# Patient Record
Sex: Male | Born: 1975 | Hispanic: No | Marital: Married | Smoking: Current every day smoker
Health system: Southern US, Community
[De-identification: ages and names within clinical notes are randomized; demographics above are authoritative.]

---

## 2014-09-10 ENCOUNTER — Ambulatory Visit (INDEPENDENT_AMBULATORY_CARE_PROVIDER_SITE_OTHER): Payer: Medicaid Other | Admitting: Family Medicine

## 2014-09-10 ENCOUNTER — Encounter: Payer: Self-pay | Admitting: Family Medicine

## 2014-09-10 VITALS — BP 113/81 | HR 78 | Temp 98.5°F | Ht 71.0 in | Wt 155.3 lb

## 2014-09-10 DIAGNOSIS — Z603 Acculturation difficulty: Secondary | ICD-10-CM

## 2014-09-10 DIAGNOSIS — Z0289 Encounter for other administrative examinations: Secondary | ICD-10-CM | POA: Insufficient documentation

## 2014-09-10 DIAGNOSIS — R519 Headache, unspecified: Secondary | ICD-10-CM | POA: Insufficient documentation

## 2014-09-10 DIAGNOSIS — L989 Disorder of the skin and subcutaneous tissue, unspecified: Secondary | ICD-10-CM

## 2014-09-10 DIAGNOSIS — L720 Epidermal cyst: Secondary | ICD-10-CM

## 2014-09-10 DIAGNOSIS — Z008 Encounter for other general examination: Secondary | ICD-10-CM

## 2014-09-10 DIAGNOSIS — R51 Headache: Secondary | ICD-10-CM

## 2014-09-10 DIAGNOSIS — R109 Unspecified abdominal pain: Secondary | ICD-10-CM

## 2014-09-10 DIAGNOSIS — J302 Other seasonal allergic rhinitis: Secondary | ICD-10-CM

## 2014-09-10 NOTE — Progress Notes (Signed)
Patient ID: Austin Wright, male   DOB: 02-05-1976, 39 y.o.   MRN: [id number] Arabic interpreter Huntley Dec from [employer] utilized during today's visit.  [facility]  HPI:  Patient presents today for a new patient appointment to establish general primary care, also to discuss headaches and to est care.   HA Seems to be associated with hunger, onset after missing a meal Described as band-like dist, lasting 2-3 hours and helped buy OTC meds in [place] which could not be named No nausea or photophobia.  Intermittent  Rash Noticed darkening of the skin of his cheeks for th elast 8 years, no precipitating event Gets darker intermittently, not necessarily associated with sun exposure  Lump on axilla Present 10+ years, non painful Yellow drainage if he squeezes it  Lower abd pain Occurs if he has have a BM Goes away when he has a BM Has been going on for about 1 year No diarrhea, normal stools Has resolved since arriving here  ROS: See HPI  Immigrant Social History: - Arrived in [place]: Oct 2015 - Language: Arabic  - Requires intepreter (essentially speaks no [place]) - Education: 9 years, 6 primary and 3 secondary - 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, handy work in [place] - Contact: [phone] - Tobacco/alcohol/drug use:   - Tobacco - smokes, 1 ppd for th elast 18 years  - Alcohol - once a year  - Drugs - No - Sexual activity: Yes, sometimes condoms - History of torture: no  Preventative Care History: -Seen at health department?: yes  Past Medical and Surgical Hx- See relevant portions of EMR  Family Hx: updated in Epic  PHYSICAL EXAM: BP 113/81 mmHg  Pulse 78  Temp(Src) 98.5 F (36.9 C) (Oral)  Ht  (1.803 m)  Wt 155 lb 4.8 oz (70.444 kg)  BMI 21.67 kg/m2 Gen: NAD, alert, cooperative with exam HEENT: NCAT, EOMI, PERRL, Nares clear, TMs clear Bl, MMM, oropharynx clear  CV: RRR, good S1/S2, no murmur Resp: CTABL, no  wheezes, non-labored Abd: SNTND, BS present, no guarding or organomegaly Ext: No edema, warm Neuro: Alert and oriented, No gross deficits Skin: Coalescing hyperpigmented macules on BL cheeks  5-10 Small (less than 1 mm) erythematous papules on chest and neck R axilla with 1 cm soft subcutaneous nodule with central umbilication, non irritated or tender to palpation     ASSESSMENT/PLAN:  Headache Tension type headache associated with hunger  no red flags Tylenol   Epidermal inclusion cyst Axillary EIC, non irritated Discussed usual course of cyst No intervention for now   Seasonal allergies Seasonal allergies with conjunctivitis and congestion Trial of zyrtec  Skin abnormality On exam has several cherry angiomas and freckles Provided reassurance  Abdominal pain Mild intermittent abd pain just previous to BMs when he has to delay BM for some reason Now resolved since coming to this country (last 2 months) Likely functional abd pain, no red flags, monitor    FOLLOW UP: F/u in 2 weeks to discuss labs.    Murtis Sink, MD [facility] Resident, PGY-3 09/10/2014, 4:25 PM

## 2014-09-10 NOTE — Assessment & Plan Note (Signed)
Axillary EIC, non irritated Discussed usual course of cyst No intervention for now

## 2014-09-10 NOTE — Assessment & Plan Note (Signed)
Seasonal allergies with conjunctivitis and congestion Trial of zyrtec

## 2014-09-10 NOTE — Patient Instructions (Signed)
   For your itchy eyes and congestion, try cetirizine, 1 pill once a day  Try tylenol (acetaminaphen) for headaches, 2 pills up to 3 times a day  Please follow up in 2 weeks when your wife comes in.

## 2014-09-10 NOTE — Assessment & Plan Note (Signed)
Mild intermittent abd pain just previous to BMs when he has to delay BM for some reason Now resolved since coming to this country (last 2 months) Likely functional abd pain, no red flags, monitor

## 2014-09-10 NOTE — Assessment & Plan Note (Signed)
Tension type headache associated with hunger  no red flags Tylenol

## 2014-09-10 NOTE — Assessment & Plan Note (Signed)
On exam has several cherry angiomas and freckles Provided reassurance

## 2014-09-14 ENCOUNTER — Ambulatory Visit
Admission: RE | Admit: 2014-09-14 | Discharge: 2014-09-14 | Disposition: A | Discharge status: Home / Self Care | Payer: No Typology Code available for payment source | Source: Ambulatory Visit | Attending: Infectious Disease | Admitting: Infectious Disease

## 2014-09-14 ENCOUNTER — Other Ambulatory Visit: Payer: Self-pay | Admitting: Infectious Disease

## 2014-09-14 DIAGNOSIS — Z139 Encounter for screening, unspecified: Secondary | ICD-10-CM

## 2014-09-21 ENCOUNTER — Ambulatory Visit: Payer: Medicaid Other | Admitting: Family Medicine

## 2015-02-25 ENCOUNTER — Emergency Department (HOSPITAL_COMMUNITY): Payer: Medicaid Other

## 2015-02-25 ENCOUNTER — Emergency Department (HOSPITAL_COMMUNITY)
Admission: EM | Admit: 2015-02-25 | Discharge: 2015-02-25 | Disposition: A | Discharge status: Home / Self Care | Payer: Medicaid Other | Attending: Emergency Medicine | Admitting: Emergency Medicine

## 2015-02-25 DIAGNOSIS — R05 Cough: Secondary | ICD-10-CM | POA: Diagnosis not present

## 2015-02-25 DIAGNOSIS — R51 Headache: Secondary | ICD-10-CM | POA: Diagnosis present

## 2015-02-25 DIAGNOSIS — M25511 Pain in right shoulder: Secondary | ICD-10-CM | POA: Diagnosis not present

## 2015-02-25 DIAGNOSIS — R5383 Other fatigue: Secondary | ICD-10-CM | POA: Diagnosis not present

## 2015-02-25 DIAGNOSIS — R6883 Chills (without fever): Secondary | ICD-10-CM | POA: Diagnosis not present

## 2015-02-25 DIAGNOSIS — R059 Cough, unspecified: Secondary | ICD-10-CM

## 2015-02-25 DIAGNOSIS — M545 Low back pain: Secondary | ICD-10-CM | POA: Diagnosis not present

## 2015-02-25 LAB — COMPREHENSIVE METABOLIC PANEL
ALBUMIN: 3.7 g/dL (ref 3.5–5.0)
ALT: 22 U/L (ref 17–63)
ANION GAP: 8 (ref 5–15)
AST: 20 U/L (ref 15–41)
Alkaline Phosphatase: 72 U/L (ref 38–126)
BUN: 7 mg/dL (ref 6–20)
CALCIUM: 9.8 mg/dL (ref 8.9–10.3)
CHLORIDE: 106 mmol/L (ref 101–111)
CO2: 25 mmol/L (ref 22–32)
Creatinine, Ser: 0.83 mg/dL (ref 0.61–1.24)
GFR calc non Af Amer: >60 mL/min (ref >60)
Glucose, Bld: 106 mg/dL — ABNORMAL HIGH (ref 65–99)
Potassium: 3.9 mmol/L (ref 3.5–5.1)
Sodium: 139 mmol/L (ref 135–145)
TOTAL PROTEIN: 7.6 g/dL (ref 6.5–8.1)
Total Bilirubin: 1 mg/dL (ref 0.3–1.2)

## 2015-02-25 LAB — CBC WITH DIFFERENTIAL/PLATELET
BASOS PCT: 0 % (ref 0–1)
Basophils Absolute: 0 10*3/uL (ref 0.0–0.1)
Eosinophils Absolute: 0.2 10*3/uL (ref 0.0–0.7)
Eosinophils Relative: 2 % (ref 0–5)
HCT: 47.3 % (ref 39.0–52.0)
HEMOGLOBIN: 16.8 g/dL (ref 13.0–17.0)
LYMPHS ABS: 2.7 10*3/uL (ref 0.7–4.0)
Lymphocytes Relative: 29 % (ref 12–46)
MCH: 30.3 pg (ref 26.0–34.0)
MCHC: 35.5 g/dL (ref 30.0–36.0)
MCV: 85.4 fL (ref 78.0–100.0)
MONO ABS: 0.9 10*3/uL (ref 0.1–1.0)
MONOS PCT: 9 % (ref 3–12)
Neutro Abs: 5.6 10*3/uL (ref 1.7–7.7)
Neutrophils Relative %: 60 % (ref 43–77)
Platelets: 200 10*3/uL (ref 150–400)
RBC: 5.54 MIL/uL (ref 4.22–5.81)
RDW: 12.8 % (ref 11.5–15.5)
WBC: 9.3 10*3/uL (ref 4.0–10.5)

## 2015-02-25 MED ORDER — IBUPROFEN 400 MG PO TABS
400.0000 mg | ORAL_TABLET | Freq: Once | ORAL | Status: AC
  Administered 2015-02-25: 400 mg via ORAL
  Filled 2015-02-25: qty 1

## 2015-02-25 NOTE — ED Notes (Signed)
Pt states that he has had headache, bodyaches and chills for 3 days. Pt states that he took medication at home for the headache but it did not help.

## 2015-02-25 NOTE — ED Notes (Signed)
Pt being tra

## 2015-02-25 NOTE — ED Notes (Signed)
Patient transported to X-ray 

## 2015-02-25 NOTE — ED Notes (Signed)
Pt speaks arabic

## 2015-02-25 NOTE — Discharge Instructions (Signed)
Take Tylenol or Advil as directed for pain. Contact the [facility] to schedule next available appointment. Ask your physician to help you to stop smoking

## 2015-02-25 NOTE — ED Provider Notes (Signed)
CSN: [id number]     Arrival date & time 02/25/15  1154 History   First MD Initiated Contact with Patient 02/25/15 1323     Chief Complaint  Patient presents with  . Generalized Body Aches  . Chills  . Headache   Patient speaks no English history is obtained from medical interpreter using [employer] which line  (Consider location/radiation/quality/duration/timing/severity/associated sxs/prior Treatment) HPI Complains of fatigue, nonproductive cough, pain at shoulder and at right paralumbar area for the past 2 days. He admits to chills. No known fever. Denies shortness of breath no nausea or vomiting no abdominal pain. Also admits to congestion in his sinuses. No treatment prior to coming here. Nothing makes symptoms better or worse. No past medical history on file. past medical history negative No past surgical history on file. No family history on file. History  Substance Use Topics  . Smoking status: Not on file  . Smokeless tobacco: Not on file  . Alcohol Use: Not on file    positive smoker no alcohol no drug use Review of Systems  Constitutional: Positive for chills and fatigue.  HENT: Negative.   Respiratory: Positive for cough.   Cardiovascular: Negative.   Gastrointestinal: Negative.   Musculoskeletal: Positive for back pain and arthralgias.  Skin: Negative.   Neurological: Negative.   Psychiatric/Behavioral: Negative.   All other systems reviewed and are negative.     Allergies  Review of patient's allergies indicates no known allergies.  Home Medications   Prior to Admission medications   Not on File   BP 117/90 mmHg  Pulse 85  Temp(Src) 97.5 F (36.4 C) (Oral)  Resp 18  SpO2 97% Physical Exam  Constitutional: He appears well-developed and well-nourished.  HENT:  Head: Normocephalic and atraumatic.  Eyes: Conjunctivae are normal. Pupils are equal, round, and reactive to light.  Neck: Neck supple. No tracheal deviation present. No thyromegaly  present.  Cardiovascular: Normal rate and regular rhythm.   No murmur heard. Pulmonary/Chest: Effort normal and breath sounds normal.  Abdominal: Soft. Bowel sounds are normal. He exhibits no distension. There is no tenderness.  Musculoskeletal: Normal range of motion. He exhibits no edema or tenderness.  Neurological: He is alert. No cranial nerve deficit. Coordination normal.  Skin: Skin is warm and dry. No rash noted.  Psychiatric: He has a normal mood and affect.  Nursing note and vitals reviewed.   ED Course  Procedures (including critical care time) Labs Review Labs Reviewed  COMPREHENSIVE METABOLIC PANEL - Abnormal; Notable for the following:    Glucose, Bld 106 (*)    All other components within normal limits  CBC WITH DIFFERENTIAL/PLATELET    Imaging Review No results found.   EKG Interpretation None     4:15 PM asymptomatic after treatment with ibuprofen. Chest x-ray viewed by me Results for orders placed or performed during the hospital encounter of 02/25/15  CBC WITH DIFFERENTIAL  Result Value Ref Range   WBC 9.3 4.0 - 10.5 K/uL   RBC 5.54 4.22 - 5.81 MIL/uL   Hemoglobin 16.8 13.0 - 17.0 g/dL   HCT 76.2 26.3 - 33.5 %   MCV 85.4 78.0 - 100.0 fL   MCH 30.3 26.0 - 34.0 pg   MCHC 35.5 30.0 - 36.0 g/dL   RDW 45.6 25.6 - 38.9 %   Platelets 200 150 - 400 K/uL   Neutrophils Relative % 60 43 - 77 %   Neutro Abs 5.6 1.7 - 7.7 K/uL   Lymphocytes Relative 29 12 -  46 %   Lymphs Abs 2.7 0.7 - 4.0 K/uL   Monocytes Relative 9 3 - 12 %   Monocytes Absolute 0.9 0.1 - 1.0 K/uL   Eosinophils Relative 2 0 - 5 %   Eosinophils Absolute 0.2 0.0 - 0.7 K/uL   Basophils Relative 0 0 - 1 %   Basophils Absolute 0.0 0.0 - 0.1 K/uL  Comprehensive metabolic panel  Result Value Ref Range   Sodium 139 135 - 145 mmol/L   Potassium 3.9 3.5 - 5.1 mmol/L   Chloride 106 101 - 111 mmol/L   CO2 25 22 - 32 mmol/L   Glucose, Bld 106 (H) 65 - 99 mg/dL   BUN 7 6 - 20 mg/dL   Creatinine,  Ser 1.61 0.61 - 1.24 mg/dL   Calcium 9.8 8.9 - 09.6 mg/dL   Total Protein 7.6 6.5 - 8.1 g/dL   Albumin 3.7 3.5 - 5.0 g/dL   AST 20 15 - 41 U/L   ALT 22 17 - 63 U/L   Alkaline Phosphatase 72 38 - 126 U/L   Total Bilirubin 1.0 0.3 - 1.2 mg/dL   GFR calc non Af Amer >60 >60 mL/min   GFR calc Af Amer >60 >60 mL/min   Anion gap 8 5 - 15   Dg Chest 2 View  02/25/2015   CLINICAL DATA:  Cough for the past 2 weeks.  Smoker.  EXAM: CHEST  2 VIEW  COMPARISON:  None.  FINDINGS: Lateral view degraded by patient arm position. Numerous leads and wires project over the chest. Midline trachea. Normal heart size and mediastinal contours. No pleural effusion or pneumothorax. Biapical of reticular nodular opacities with calcification. No lobar consolidation. Diffuse peribronchial thickening.  IMPRESSION: No acute cardiopulmonary disease.  Peribronchial thickening which may relate to chronic bronchitis or smoking.  Biapical reticular nodular opacities with calcification. Favored to be related to post infectious or inflammatory scarring.   Electronically Signed   By: Jeronimo Greaves M.D.   On: 02/25/2015 15:27    MDM  Suspect viral illness Plan follow-up [facility] Counseled patient for 5 minutes on smoking cessation. Tylenol or Advil for pain I suspect the patient has viral illness Diagnosis #1 cough #2 arthralgias #3 tobacco abuse Final diagnoses:  None        Doug Sou, MD 02/25/15 1621

## 2015-02-25 NOTE — ED Notes (Signed)
EDP at bedside discussing results and D/C information with patient via translator service.

## 2015-08-07 ENCOUNTER — Emergency Department (HOSPITAL_COMMUNITY): Payer: Medicaid Other

## 2015-08-07 ENCOUNTER — Encounter (HOSPITAL_COMMUNITY): Payer: Self-pay | Admitting: Emergency Medicine

## 2015-08-07 ENCOUNTER — Emergency Department (HOSPITAL_COMMUNITY)
Admission: EM | Admit: 2015-08-07 | Discharge: 2015-08-07 | Disposition: A | Discharge status: Home / Self Care | Payer: Medicaid Other | Attending: Emergency Medicine | Admitting: Emergency Medicine

## 2015-08-07 DIAGNOSIS — F17200 Nicotine dependence, unspecified, uncomplicated: Secondary | ICD-10-CM | POA: Insufficient documentation

## 2015-08-07 DIAGNOSIS — R000 Tachycardia, unspecified: Secondary | ICD-10-CM | POA: Insufficient documentation

## 2015-08-07 DIAGNOSIS — M791 Myalgia: Secondary | ICD-10-CM | POA: Diagnosis present

## 2015-08-07 DIAGNOSIS — B349 Viral infection, unspecified: Secondary | ICD-10-CM | POA: Diagnosis not present

## 2015-08-07 LAB — CBC WITH DIFFERENTIAL/PLATELET
BASOS ABS: 0 10*3/uL (ref 0.0–0.1)
BASOS PCT: 0 %
Eosinophils Absolute: 0.1 10*3/uL (ref 0.0–0.7)
Eosinophils Relative: 1 %
HEMATOCRIT: 46.2 % (ref 39.0–52.0)
Hemoglobin: 16.1 g/dL (ref 13.0–17.0)
Lymphocytes Relative: 15 %
Lymphs Abs: 1.7 10*3/uL (ref 0.7–4.0)
MCH: 30 pg (ref 26.0–34.0)
MCHC: 34.8 g/dL (ref 30.0–36.0)
MCV: 86 fL (ref 78.0–100.0)
Monocytes Absolute: 0.7 10*3/uL (ref 0.1–1.0)
Monocytes Relative: 7 %
NEUTROS PCT: 77 %
Neutro Abs: 8.8 10*3/uL — ABNORMAL HIGH (ref 1.7–7.7)
PLATELETS: 183 10*3/uL (ref 150–400)
RBC: 5.37 MIL/uL (ref 4.22–5.81)
RDW: 13 % (ref 11.5–15.5)
WBC: 11.2 10*3/uL — ABNORMAL HIGH (ref 4.0–10.5)

## 2015-08-07 LAB — BASIC METABOLIC PANEL
ANION GAP: 8 (ref 5–15)
BUN: 7 mg/dL (ref 6–20)
CALCIUM: 9.6 mg/dL (ref 8.9–10.3)
CO2: 25 mmol/L (ref 22–32)
Chloride: 105 mmol/L (ref 101–111)
Creatinine, Ser: 0.94 mg/dL (ref 0.61–1.24)
Glucose, Bld: 109 mg/dL — ABNORMAL HIGH (ref 65–99)
Potassium: 3.9 mmol/L (ref 3.5–5.1)
Sodium: 138 mmol/L (ref 135–145)

## 2015-08-07 MED ORDER — SODIUM CHLORIDE 0.9 % IV BOLUS (SEPSIS)
1000.0000 mL | Freq: Once | INTRAVENOUS | Status: AC
  Administered 2015-08-07: 1000 mL via INTRAVENOUS

## 2015-08-07 MED ORDER — SODIUM CHLORIDE 0.9 % IV BOLUS (SEPSIS)
1000.0000 mL | Freq: Once | INTRAVENOUS | Status: AC
Start: 2015-08-07 — End: 2015-08-07
  Administered 2015-08-07: 1000 mL via INTRAVENOUS

## 2015-08-07 MED ORDER — KETOROLAC TROMETHAMINE 30 MG/ML IJ SOLN
30.0000 mg | Freq: Once | INTRAMUSCULAR | Status: AC
  Administered 2015-08-07: 30 mg via INTRAVENOUS
  Filled 2015-08-07: qty 1

## 2015-08-07 MED ORDER — DEXAMETHASONE SODIUM PHOSPHATE 10 MG/ML IJ SOLN
10.0000 mg | Freq: Once | INTRAMUSCULAR | Status: AC
  Administered 2015-08-07: 10 mg via INTRAVENOUS
  Filled 2015-08-07: qty 1

## 2015-08-07 MED ORDER — ACETAMINOPHEN 500 MG PO TABS
1000.0000 mg | ORAL_TABLET | Freq: Once | ORAL | Status: AC
  Administered 2015-08-07: 1000 mg via ORAL
  Filled 2015-08-07: qty 2

## 2015-08-07 NOTE — Discharge Instructions (Signed)
Viral Infections Austin Wright, your blood work and chest xray today were normal.  It is very important for you to see a primary care doctor within 3 days for close follow up.  If any symptoms worsen, come back to the ED immediately. Thank you.   [name]         .           3    .          . . alsyd [name], kan aleamal fi alddam wal'ashieeat alssiniat sadruk alyawm aleadi. min almhm jiddaan balnsbt lk lirawyat tabib alrrieayat al'awwaliat khilal 3 'ayam limutabaeat ean qarb. 'iidha sa'at 'ay 'aeradin, yaeud 'iilaa aldduef aljinsi ealaa alfawr. shukra.   A virus is a type of germ. Viruses can cause:  Minor sore throats.  Aches and pains.  Headaches.  Runny nose.  Rashes.  Watery eyes.  Tiredness.  Coughs.  Loss of appetite.  Feeling sick to your stomach (nausea).  Throwing up (vomiting).  Watery poop (diarrhea). HOME CARE   Only take medicines as told by your doctor.  Drink enough water and fluids to keep your pee (urine) clear or pale yellow. Sports drinks are a good choice.  Get plenty of rest and eat healthy. Soups and broths with crackers or rice are fine. GET HELP RIGHT AWAY IF:   You have a very bad headache.  You have shortness of breath.  You have chest pain or neck pain.  You have an unusual rash.  You cannot stop throwing up.  You have watery poop that does not stop.  You cannot keep fluids down.  You or your child has a temperature by mouth above 102 F (38.9 C), not controlled by medicine.  Your baby is older than 3 months with a rectal temperature of 102 F (38.9 C) or higher.  Your baby is 883 months old or younger with a rectal temperature of 100.4 F (38 C) or higher. MAKE SURE YOU:   Understand these instructions.  Will watch this condition.  Will get help right away if you are  not doing well or get worse.   This information is not intended to replace advice given to you by your health care provider. Make sure you discuss any questions you have with your health care provider.   Document Released: 08/06/2008 Document Revised: 11/16/2011 Document Reviewed: 01/30/2015 Elsevier Interactive Patient Education [employer]2016 Elsevier Inc.

## 2015-08-07 NOTE — ED Notes (Addendum)
Called interpreter [phone]#[id number]

## 2015-08-07 NOTE — ED Notes (Signed)
Pt. reports generalized body aches , fever, chills and occasional dry cough onset today. Arabic interpreter service used at triage .

## 2015-08-07 NOTE — ED Provider Notes (Signed)
CSN: [id number]     Arrival date & time 08/07/15  0015 History  By signing my name below, I, Freida Busman, attest that this documentation has been prepared under the direction and in the presence of Tomasita Crumble, MD . Electronically Signed: Freida Busman, Scribe. 08/07/2015. 2:21 AM.  Chief Complaint  Patient presents with  . Generalized Body Aches    The history is provided by the patient. A language interpreter was used.    HPI Comments:  Austin Wright is a 39 y.o. male who presents to the Emergency Department complaining of generalized myalgias with 8/10 pain and fatigue. He states his symptoms began when he returned from work yesterday evening. He reports associated fever with max temp 39 C (102 F), sore throat, chills, and productive cough. Pt's sister with similar symptoms. No alleviating factors noted.  Pt is a current everyday smoker. Pt's native language is Arabic; language line utilized to obtain history.   History reviewed. No pertinent past medical history. History reviewed. No pertinent past surgical history. No family history on file. Social History  Substance Use Topics  . Smoking status: Current Every Day Smoker  . Smokeless tobacco: None  . Alcohol Use: Yes    Review of Systems  10 systems reviewed and all are negative for acute change except as noted in the HPI.  Allergies  Review of patient's allergies indicates no known allergies.  Home Medications   Prior to Admission medications   Not on File   BP 108/82 mmHg  Pulse 90  Temp(Src) 100.1 F (37.8 C) (Oral)  Resp 22  Ht 6' (1.829 m)  Wt 154 lb (69.854 kg)  BMI 20.88 kg/m2  SpO2 97% Physical Exam  Constitutional: He is oriented to person, place, and time. Vital signs are normal. He appears well-developed and well-nourished.  Non-toxic appearance. He does not appear ill. No distress.  HENT:  Head: Normocephalic and atraumatic.  Nose: Nose normal.  Mouth/Throat: Oropharynx is clear and moist.  No oropharyngeal exudate.  Eyes: Conjunctivae and EOM are normal. Pupils are equal, round, and reactive to light. No scleral icterus.  Neck: Normal range of motion. Neck supple. No tracheal deviation, no edema, no erythema and normal range of motion present. No thyroid mass and no thyromegaly present.  Cardiovascular: Regular rhythm, S1 normal, S2 normal, normal heart sounds, intact distal pulses and normal pulses.  Tachycardia present.  Exam reveals no gallop and no friction rub.   No murmur heard. Pulmonary/Chest: Effort normal and breath sounds normal. No respiratory distress. He has no wheezes. He has no rhonchi. He has no rales.  Abdominal: Soft. Normal appearance and bowel sounds are normal. He exhibits no distension, no ascites and no mass. There is no hepatosplenomegaly. There is no tenderness. There is no rebound, no guarding and no CVA tenderness.  Musculoskeletal: Normal range of motion. He exhibits no edema or tenderness.  Lymphadenopathy:    He has no cervical adenopathy.  Neurological: He is alert and oriented to person, place, and time. He has normal strength. No cranial nerve deficit or sensory deficit.  Skin: Skin is dry and intact. No petechiae and no rash noted. He is not diaphoretic. No erythema. No pallor.  Tactile Fever  Psychiatric: He has a normal mood and affect. His behavior is normal. Judgment normal.  Nursing note and vitals reviewed.   ED Course  Procedures   DIAGNOSTIC STUDIES:  Oxygen Saturation is 97% on RA, normal by my interpretation.    COORDINATION OF CARE:  2:16 AM Discussed treatment plan with pt at bedside and pt agreed to plan.  Labs Review Labs Reviewed  CBC WITH DIFFERENTIAL/PLATELET - Abnormal; Notable for the following:    WBC 11.2 (*)    Neutro Abs 8.8 (*)    All other components within normal limits  BASIC METABOLIC PANEL - Abnormal; Notable for the following:    Glucose, Bld 109 (*)    All other components within normal limits     Imaging Review Dg Chest 2 View  08/07/2015  CLINICAL DATA:  Generalized body aches, fevers and chills. Nonproductive cough. Onset today. EXAM: CHEST  2 VIEW COMPARISON:  02/25/2015 FINDINGS: Diffuse upper lobe nodularity persists without significant interval change and is likely due to sequela of prior infectious or granulomatous disease. No acute airspace opacity. No effusion. Pulmonary vasculature is normal. Hilar, mediastinal and cardiac contours are unremarkable and unchanged. Heart size is normal. IMPRESSION: Chronic upper lobe nodularity.  No acute cardiopulmonary findings. Electronically Signed   By: Ellery Plunkaniel R Mitchell M.D.   On: 08/07/2015 02:26   I have personally reviewed and evaluated these images and lab results as part of my medical decision-making.   EKG Interpretation None      MDM   Final diagnoses:  None    Patient presents to the emergency department for viral-like symptoms. His most significant complaint is the diffuse body aches which he feels goes down to all of his bones. He also has a cough which is not changed from his normal smoking cough. He states he has sick contacts and his sister who had similar symptoms. Chest x-ray is negative for pneumonia, laboratory studies are unremarkable. There is a leukocytosis of 11.2 which is nonspecific. He was given Tylenol, Toradol for his symptoms. Fever has resolved to 98.6 upon my repeat evaluation. Patient also given Decadron for sore throat. Upon repeat evaluation, patient states he feels much better.  He appears well and in no acute distress, vital signs were within his normal limits and he is safe for discharge.   I personally performed the services described in this documentation, which was scribed in my presence. The recorded information has been reviewed and is accurate.     Tomasita CrumbleAdeleke Fredrika Canby, MD 08/07/15 [phone]

## 2015-08-09 ENCOUNTER — Ambulatory Visit (INDEPENDENT_AMBULATORY_CARE_PROVIDER_SITE_OTHER): Payer: Medicaid Other | Admitting: Family Medicine

## 2015-08-09 ENCOUNTER — Encounter: Payer: Self-pay | Admitting: Family Medicine

## 2015-08-09 VITALS — BP 131/77 | HR 84 | Temp 97.8°F | Ht 71.0 in | Wt 155.5 lb

## 2015-08-09 DIAGNOSIS — Z23 Encounter for immunization: Secondary | ICD-10-CM

## 2015-08-09 DIAGNOSIS — J069 Acute upper respiratory infection, unspecified: Secondary | ICD-10-CM

## 2015-08-09 NOTE — Progress Notes (Signed)
   Subjective:    Patient ID: Austin Wright, male    DOB: [birth year], 39 y.o.   MRN: [id number]  HPI  Video interpreter used [facility] #[id number] ED visit under a different chart, MRN [id number]  Patient presents for Same Day Appointment  CC: sore throat  # URI symptoms:  Started about 5-7 days ago. Went to ED 2 days ago and was diagnosed with virus  Overall feels better  Symptoms included sinus pressure causing headache, nasal congestion, sore throat which started 2 days ago, also felt feverish (no measured) and body aches.  Has not taken anything other than ibuprofen for his symptoms  No nausea or vomiting  Friend at work saw his ED paperwork, showed his boss and said he couldn't work until he was treated  Social Hx: current smoker  Review of Systems   See HPI for ROS.   Past medical history, surgical, family, and social history reviewed and updated in the EMR as appropriate.  Objective:  BP 131/77 mmHg  Pulse 84  Temp(Src) 97.8 F (36.6 C) (Oral)  Ht 5\' 11"  (1.803 m)  Wt 155 lb 8 oz (70.534 kg)  BMI 21.70 kg/m2 Vitals and nursing note reviewed  General: NAD HEENT: normal conjunctiva and sclera. There is mild posterior pharyngeal erythema without exudate. Moist mucous membranes.  Neck: no cervical lymphadenopathy  Assessment & Plan:   1. Viral URI Agree with likely viral etiology. OTC symptomatic relief. Work note given to return Monday. If symptoms persist >1 week return to clinic. Also recommended taking daily allergy medicine as he likely has some underlying symptoms of this. Otherwise follow up as needed.

## 2015-08-09 NOTE — Patient Instructions (Signed)
Your symptoms are due to a viral illness. Antibiotics will not help improve your symptoms, but the following will help you feel better while your body fights the virus.   Stay hydrated - drink a lot of water   Nasal Saline Spray  Congestion:   Nose spray: Afrin (Phenylephrine). DO NOT USE MORE THAN 3 DAYS  Oral: Pseudoephedrine  Sneezing & Runny nose: Cetirizine (Zyrtec), Fexofenadine (Allegra), Loratadine (Claritin)  Pain/Sore throat: Tylenol, Ibuprofen  Cough: Dextromethorphan, Guaifenesin ("Mucinex")  Wash your hands often to prevent spreading the virus

## 2016-02-07 ENCOUNTER — Encounter: Payer: Self-pay | Admitting: Family Medicine

## 2017-05-28 IMAGING — DX DG CHEST 2V
2 series · 2 of 2 positions shown · non-contrast
Comparison: 02/25/2015

CLINICAL DATA: Generalized body aches, fevers and chills.
Nonproductive cough. Onset today.

EXAM:
CHEST  2 VIEW

[chest pa]
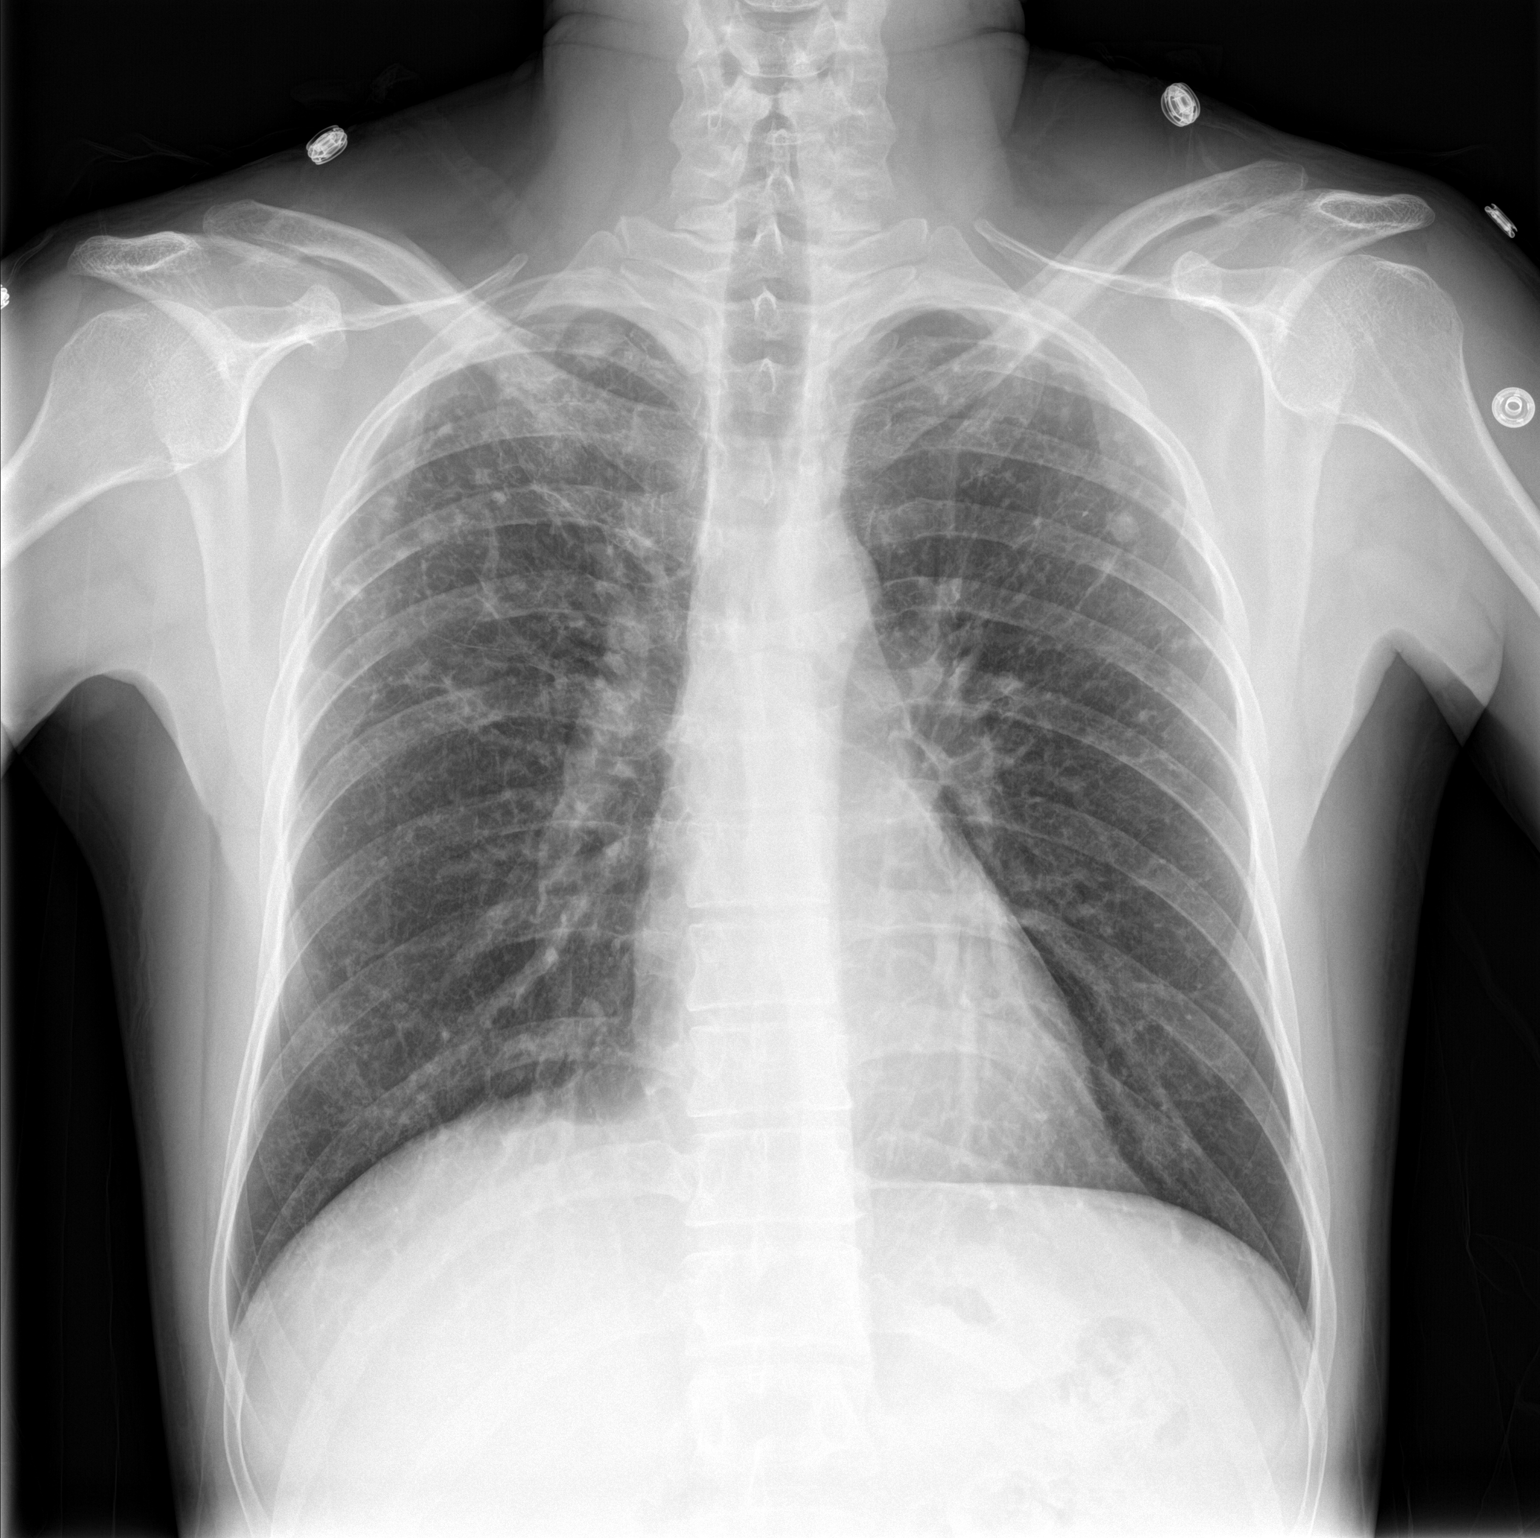

[chest lat]
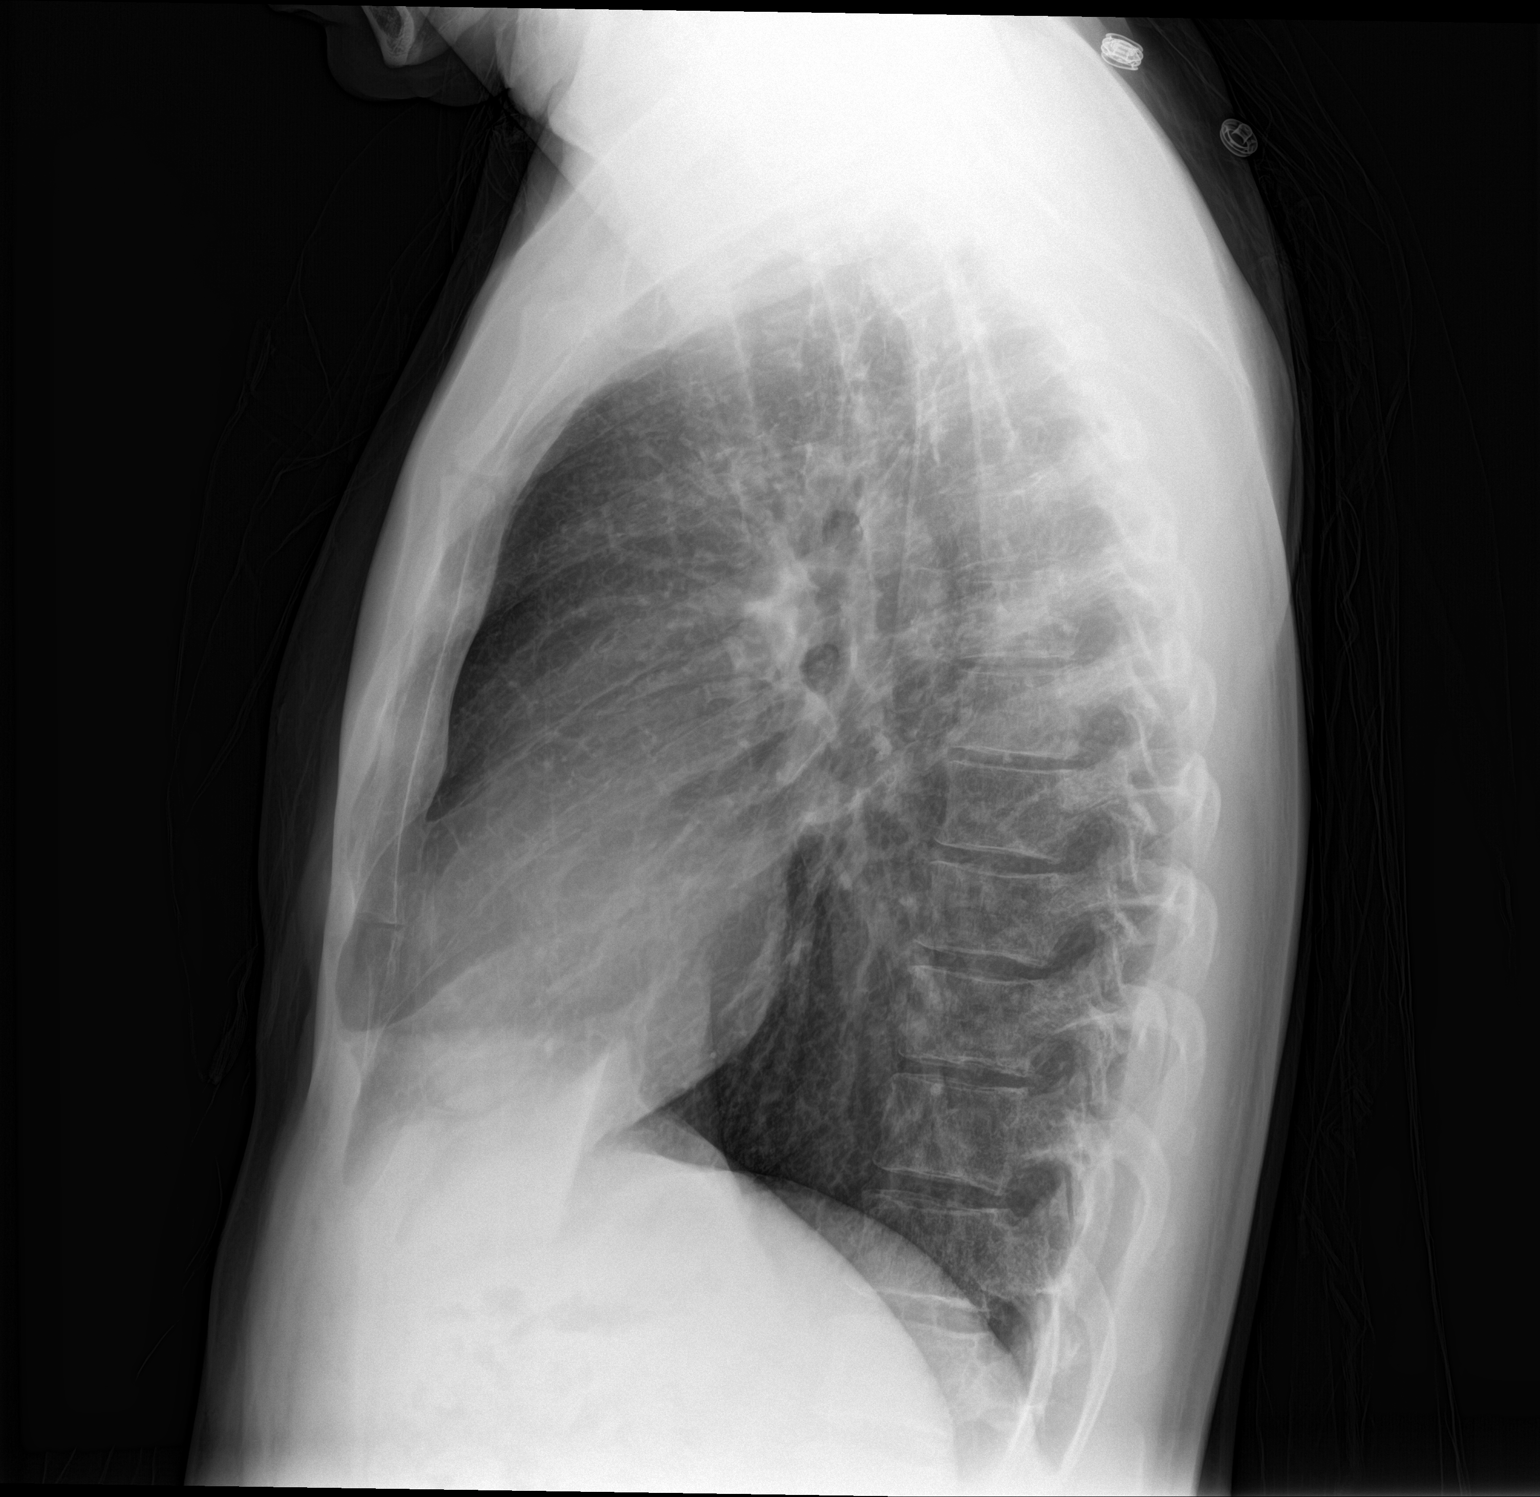

[2 of 2 positions shown; findings below may reference images not displayed]

FINDINGS: Diffuse upper lobe nodularity persists without significant interval
change and is likely due to sequela of prior infectious or
granulomatous disease. No acute airspace opacity. No effusion.
Pulmonary vasculature is normal. Hilar, mediastinal and cardiac
contours are unremarkable and unchanged. Heart size is normal.
IMPRESSION: Chronic upper lobe nodularity.  No acute cardiopulmonary findings.
